# Patient Record
Sex: Male | Born: 1955
Health system: Southern US, Community
[De-identification: ages and names within clinical notes are randomized; demographics above are authoritative.]

## PROBLEM LIST (undated history)

## (undated) DIAGNOSIS — I10 Essential (primary) hypertension: Secondary | ICD-10-CM

## (undated) HISTORY — PX: CERVICAL SPINE SURGERY: SHX589

## (undated) HISTORY — PX: GUM SURGERY: SHX658

---

## 2013-04-17 ENCOUNTER — Ambulatory Visit: Payer: Self-pay

## 2013-04-22 ENCOUNTER — Ambulatory Visit: Payer: Self-pay

## 2013-05-14 ENCOUNTER — Ambulatory Visit: Payer: Medicaid Other | Attending: Internal Medicine | Admitting: Internal Medicine

## 2013-05-14 ENCOUNTER — Encounter: Payer: Self-pay | Admitting: Internal Medicine

## 2013-05-14 VITALS — BP 147/95 | HR 78 | Temp 98.7°F | Resp 17 | Wt 253.4 lb

## 2013-05-14 DIAGNOSIS — I10 Essential (primary) hypertension: Secondary | ICD-10-CM | POA: Insufficient documentation

## 2013-05-14 DIAGNOSIS — Z139 Encounter for screening, unspecified: Secondary | ICD-10-CM

## 2013-05-14 DIAGNOSIS — Z008 Encounter for other general examination: Secondary | ICD-10-CM | POA: Insufficient documentation

## 2013-05-14 DIAGNOSIS — E785 Hyperlipidemia, unspecified: Secondary | ICD-10-CM

## 2013-05-14 DIAGNOSIS — R229 Localized swelling, mass and lump, unspecified: Secondary | ICD-10-CM

## 2013-05-14 DIAGNOSIS — F17200 Nicotine dependence, unspecified, uncomplicated: Secondary | ICD-10-CM | POA: Insufficient documentation

## 2013-05-14 DIAGNOSIS — R351 Nocturia: Secondary | ICD-10-CM

## 2013-05-14 DIAGNOSIS — Z1211 Encounter for screening for malignant neoplasm of colon: Secondary | ICD-10-CM

## 2013-05-14 DIAGNOSIS — R222 Localized swelling, mass and lump, trunk: Secondary | ICD-10-CM | POA: Insufficient documentation

## 2013-05-14 NOTE — Patient Instructions (Signed)
DASH Diet  The DASH diet stands for "Dietary Approaches to Stop Hypertension." It is a healthy eating plan that has been shown to reduce high blood pressure (hypertension) in as little as 14 days, while also possibly providing other significant health benefits. These other health benefits include reducing the risk of breast cancer after menopause and reducing the risk of type 2 diabetes, heart disease, colon cancer, and stroke. Health benefits also include weight loss and slowing kidney failure in patients with chronic kidney disease.   DIET GUIDELINES  · Limit salt (sodium). Your diet should contain less than 1500 mg of sodium daily.  · Limit refined or processed carbohydrates. Your diet should include mostly whole grains. Desserts and added sugars should be used sparingly.  · Include small amounts of heart-healthy fats. These types of fats include nuts, oils, and tub margarine. Limit saturated and trans fats. These fats have been shown to be harmful in the body.  CHOOSING FOODS   The following food groups are based on a 2000 calorie diet. See your Registered Dietitian for individual calorie needs.  Grains and Grain Products (6 to 8 servings daily)  · Eat More Often: Whole-wheat bread, brown rice, whole-grain or wheat pasta, quinoa, popcorn without added fat or salt (air popped).  · Eat Less Often: White bread, white pasta, white rice, cornbread.  Vegetables (4 to 5 servings daily)  · Eat More Often: Fresh, frozen, and canned vegetables. Vegetables may be raw, steamed, roasted, or grilled with a minimal amount of fat.  · Eat Less Often/Avoid: Creamed or fried vegetables. Vegetables in a cheese sauce.  Fruit (4 to 5 servings daily)  · Eat More Often: All fresh, canned (in natural juice), or frozen fruits. Dried fruits without added sugar. One hundred percent fruit juice (½ cup [237 mL] daily).  · Eat Less Often: Dried fruits with added sugar. Canned fruit in light or heavy syrup.  Lean Meats, Fish, and Poultry (2  servings or less daily. One serving is 3 to 4 oz [85-114 g]).  · Eat More Often: Ninety percent or leaner ground beef, tenderloin, sirloin. Round cuts of beef, chicken breast, turkey breast. All fish. Grill, bake, or broil your meat. Nothing should be fried.  · Eat Less Often/Avoid: Fatty cuts of meat, turkey, or chicken leg, thigh, or wing. Fried cuts of meat or fish.  Dairy (2 to 3 servings)  · Eat More Often: Low-fat or fat-free milk, low-fat plain or light yogurt, reduced-fat or part-skim cheese.  · Eat Less Often/Avoid: Milk (whole, 2%). Whole milk yogurt. Full-fat cheeses.  Nuts, Seeds, and Legumes (4 to 5 servings per week)  · Eat More Often: All without added salt.  · Eat Less Often/Avoid: Salted nuts and seeds, canned beans with added salt.  Fats and Sweets (limited)  · Eat More Often: Vegetable oils, tub margarines without trans fats, sugar-free gelatin. Mayonnaise and salad dressings.  · Eat Less Often/Avoid: Coconut oils, palm oils, butter, stick margarine, cream, half and half, cookies, candy, pie.  FOR MORE INFORMATION  The Dash Diet Eating Plan: www.dashdiet.org  Document Released: 01/31/2011 Document Revised: 05/06/2011 Document Reviewed: 01/31/2011  ExitCare® Patient Information ©2014 [employer].

## 2013-05-14 NOTE — Progress Notes (Signed)
Patient here to establish care Takes no prescribed medications

## 2013-05-14 NOTE — Progress Notes (Signed)
Patient Demographics  Lawrence Everett, is a 58 y.o. male  XIP:[id number]  QBH:[id number]  DOB - [birth year]  CC:  Chief Complaint  Patient presents with  . Establish Care       HPI: Lawrence Everett is a 58 y.o. male here today to establish medical care. Patient has history of hypertension hyperlipidemia, has not taken any medication for that, patient is trying to lose weight to exercise and modify the diet, he also reported to have some daily and frequency denies any dysuria, he sometimes take some medication to help with nocturia, as per patient he had his prostate checked in the past, but reported to have family history of prostate cancer. Patient also reported to have noticed a lump on his back for several months no change in size not painful. Patient has No headache, No chest pain, No abdominal pain - No Nausea, No new weakness tingling or numbness, No Cough - SOB.  No Known Allergies History reviewed. No pertinent past medical history. No current outpatient prescriptions on file prior to visit.   No current facility-administered medications on file prior to visit.   Family History  Problem Relation Age of Onset  . Cancer Mother    History   Social History  . Marital Status: Unknown    Spouse Name: N/A    Number of Children: N/A  . Years of Education: N/A   Occupational History  . Not on file.   Social History Main Topics  . Smoking status: Light Tobacco Smoker  . Smokeless tobacco: Not on file     Comment: some marijuana  . Alcohol Use: No  . Drug Use: Yes    Special: Marijuana     Comment: 3 times a week   . Sexual Activity: Not on file   Other Topics Concern  . Not on file   Social History Narrative  . No narrative on file    Review of Systems: Constitutional: Negative for fever, chills, diaphoresis, activity change, appetite change and fatigue. HENT: Negative for ear pain, nosebleeds, congestion, facial swelling, rhinorrhea, neck pain, neck stiffness  and ear discharge.  Eyes: Negative for pain, discharge, redness, itching and visual disturbance. Respiratory: Negative for cough, choking, chest tightness, shortness of breath, wheezing and stridor.  Cardiovascular: Negative for chest pain, palpitations and leg swelling. Gastrointestinal: Negative for abdominal distention. Genitourinary: Negative for dysuria, urgency, +frequency, hematuria, flank pain, decreased urine volume, difficulty urinating and dyspareunia.  Musculoskeletal: Negative for back pain, joint swelling, arthralgia and gait problem. Neurological: Negative for dizziness, tremors, seizures, syncope, facial asymmetry, speech difficulty, weakness, light-headedness, numbness and headaches.  Hematological: Negative for adenopathy. Does not bruise/bleed easily. Psychiatric/Behavioral: Negative for hallucinations, behavioral problems, confusion, dysphoric mood, decreased concentration and agitation.    Objective:   Filed Vitals:   05/14/13 1128  BP: 147/95  Pulse: 78  Temp: 98.7 F (37.1 C)  Resp: 17    Physical Exam: Constitutional: Patient appears well-developed and well-nourished. No distress. HENT: Normocephalic, atraumatic, External right and left ear normal. Oropharynx is clear and moist.  Eyes: Conjunctivae and EOM are normal. PERRLA, no scleral icterus. Neck: Normal ROM. Neck supple. No JVD. No tracheal deviation. No thyromegaly. CVS: RRR, S1/S2 +, no murmurs, no gallops, no carotid bruit.  Pulmonary: Effort and breath sounds normal, no stridor, rhonchi, wheezes, rales.  Abdominal: Soft. BS +, no distension, tenderness, rebound or guarding.   Neuro: Alert. Normal reflexes, muscle tone coordination. No cranial nerve deficit. Skin: right upper back palpable lump  non tender mobile, no skin discharge  Psychiatric: Normal mood and affect. Behavior, judgment, thought content normal.  No results found for this basename: WBC, HGB, HCT, MCV, PLT   No results found for this  basename: CREATININE, BUN, NA, K, CL, CO2    No results found for this basename: HGBA1C   Lipid Panel  No results found for this basename: chol, trig, hdl, cholhdl, vldl, ldlcalc       Assessment and plan:   1. Other and unspecified hyperlipidemia Advise patient for low fat diet, will check lipid panel  2. Essential hypertension, benign Advised patient for DASH diet monitor blood pressure.   3. Screening Baseline fasting blood work - COMPLETE METABOLIC PANEL WITH GFR; Future - Lipid panel; Future - TSH; Future - Vit D  25 hydroxy (rtn osteoporosis monitoring); Future - Urinalysis - PSA; Future  4. Special screening for malignant neoplasms, colon  - Ambulatory referral to Gastroenterology  5. Nocturia  - Urinalysis  6. Lump of skin of back  ? Lipoma, discussed about if he notice any change, he will let us know then consider referral to general surgery.  Return in about 6 weeks (around 06/25/2013).   Lawrence Marek, MD

## 2013-05-15 LAB — URINALYSIS
BILIRUBIN URINE: NEGATIVE
GLUCOSE, UA: NEGATIVE mg/dL
HGB URINE DIPSTICK: NEGATIVE
KETONES UR: NEGATIVE mg/dL
Leukocytes, UA: NEGATIVE
Nitrite: NEGATIVE
PH: 6.5 (ref 5.0–8.0)
Protein, ur: NEGATIVE mg/dL
Specific Gravity, Urine: 1.019 (ref 1.005–1.030)
Urobilinogen, UA: 0.2 mg/dL (ref 0.0–1.0)

## 2013-05-28 ENCOUNTER — Ambulatory Visit: Payer: Medicaid Other | Attending: Internal Medicine

## 2013-06-14 ENCOUNTER — Encounter: Payer: Self-pay | Admitting: Internal Medicine

## 2013-06-28 ENCOUNTER — Encounter: Payer: Self-pay | Admitting: Internal Medicine

## 2013-06-28 ENCOUNTER — Ambulatory Visit: Payer: Medicaid Other | Attending: Internal Medicine | Admitting: Internal Medicine

## 2013-06-28 VITALS — BP 139/89 | HR 73 | Temp 98.7°F | Resp 16

## 2013-06-28 DIAGNOSIS — I10 Essential (primary) hypertension: Secondary | ICD-10-CM

## 2013-06-28 DIAGNOSIS — Z1211 Encounter for screening for malignant neoplasm of colon: Secondary | ICD-10-CM

## 2013-06-28 DIAGNOSIS — E785 Hyperlipidemia, unspecified: Secondary | ICD-10-CM

## 2013-06-28 DIAGNOSIS — K219 Gastro-esophageal reflux disease without esophagitis: Secondary | ICD-10-CM | POA: Insufficient documentation

## 2013-06-28 NOTE — Progress Notes (Signed)
    MRN: [id number] Name: Lawrence Everett  Sex: male Age: 58 y.o. DOB: [birth year]  Allergies: Review of patient's allergies indicates no known allergies.  Chief Complaint  Patient presents with  . Results    HPI: Patient is 58 y.o. male who has history of GERD, hyperlipidemia comes today for followup her not on medications, patient forgot to do blood work, denies any acute symptoms denies any headache dizziness chest and shortness of breath, patient is trying to lose weight his blood pressure is improved compared to last visit. Patient denies smoking cigarettes. Patient was referred to GI for screening colonoscopy at that time patient could not schedule now he has insurance and needs another referral.  History reviewed. No pertinent past medical history.  Past Surgical History  Procedure Laterality Date  . Gum surgery        Medication List    Notice As of 06/28/2013  3:23 PM   You have not been prescribed any medications.      No orders of the defined types were placed in this encounter.     There is no immunization history on file for this patient.  Family History  Problem Relation Age of Onset  . Cancer Mother     History  Substance Use Topics  . Smoking status: Light Tobacco Smoker  . Smokeless tobacco: Not on file     Comment: some marijuana  . Alcohol Use: No    Review of Systems   As noted in HPI  Filed Vitals:   06/28/13 1514  BP: 139/89  Pulse: 73  Temp: 98.7 F (37.1 C)  Resp: 16    Physical Exam  Physical Exam  Constitutional: No distress.  Eyes: EOM are normal. Pupils are equal, round, and reactive to light.  Cardiovascular: Normal rate and regular rhythm.   Pulmonary/Chest: Breath sounds normal. No respiratory distress. He has no wheezes. He has no rales.  Musculoskeletal: He exhibits no edema.    CBC No results found for this basename: wbc, rbc, hgb, hct, plt, mcv, neutrabs, lymphsabs, monoabs, eosabs, basosabs    CMP  No results  found for this basename: na, k, cl, co2, glucose, bun, creatinine, calcium, prot, albumin, ast, alt, alkphos, bilitot, gfrnonaa, gfraa    No results found for this basename: chol, tri, ldl    No components found with this basename: hga1c    No results found for this basename: AST    Assessment and Plan  Essential hypertension, benign Continue with DASH diet  and exercise.  Other and unspecified hyperlipidemia Will check lipid panel. Patient will come back for fasting blood work.   Special screening for malignant neoplasms, colon - Plan: Ambulatory referral to Gastroenterology   Health Maintenance -Colonoscopy: referred to GI    Return in about 3 months (around 09/28/2013) for hypertension.  Lorayne Marek, MD

## 2013-06-28 NOTE — Progress Notes (Signed)
Patient states her for lab results Patient did not return to do the labs that were in the system as future orders

## 2013-06-29 ENCOUNTER — Other Ambulatory Visit: Payer: Medicaid Other

## 2013-06-30 ENCOUNTER — Ambulatory Visit: Payer: Medicaid Other | Attending: Internal Medicine

## 2013-06-30 DIAGNOSIS — Z139 Encounter for screening, unspecified: Secondary | ICD-10-CM

## 2013-07-01 ENCOUNTER — Telehealth: Payer: Self-pay

## 2013-07-01 LAB — LIPID PANEL
CHOLESTEROL: 145 mg/dL (ref 0–200)
HDL: 31 mg/dL — ABNORMAL LOW (ref >39)
LDL Cholesterol: 101 mg/dL — ABNORMAL HIGH (ref 0–99)
TRIGLYCERIDES: 67 mg/dL (ref <150)
Total CHOL/HDL Ratio: 4.7 Ratio
VLDL: 13 mg/dL (ref 0–40)

## 2013-07-01 LAB — COMPLETE METABOLIC PANEL WITH GFR
ALBUMIN: 4.2 g/dL (ref 3.5–5.2)
ALT: 13 U/L (ref 0–53)
AST: 15 U/L (ref 0–37)
Alkaline Phosphatase: 67 U/L (ref 39–117)
BUN: 13 mg/dL (ref 6–23)
CALCIUM: 9.3 mg/dL (ref 8.4–10.5)
CHLORIDE: 102 meq/L (ref 96–112)
CO2: 26 meq/L (ref 19–32)
Creat: 0.92 mg/dL (ref 0.50–1.35)
GFR, Est African American: >89 mL/min
GFR, Est Non African American: >89 mL/min
Glucose, Bld: 82 mg/dL (ref 70–99)
Potassium: 4.4 mEq/L (ref 3.5–5.3)
Sodium: 139 mEq/L (ref 135–145)
Total Bilirubin: 0.7 mg/dL (ref 0.2–1.2)
Total Protein: 7.6 g/dL (ref 6.0–8.3)

## 2013-07-01 LAB — PSA: PSA: 0.53 ng/mL (ref <=4.00)

## 2013-07-01 LAB — TSH: TSH: 1.531 u[IU]/mL (ref 0.350–4.500)

## 2013-07-01 LAB — VITAMIN D 25 HYDROXY (VIT D DEFICIENCY, FRACTURES): Vit D, 25-Hydroxy: 26 ng/mL — ABNORMAL LOW (ref 30–89)

## 2013-07-01 MED ORDER — VITAMIN D (ERGOCALCIFEROL) 1.25 MG (50000 UNIT) PO CAPS: 50000.0000 [IU] | ORAL_CAPSULE | ORAL | Status: DC

## 2013-07-01 NOTE — Telephone Encounter (Signed)
Patient not available Left message on  Voice mail to return our call 

## 2013-07-01 NOTE — Telephone Encounter (Signed)
Message copied by Dorothe Pea on Thu Jul 01, 2013 11:40 AM ------      Message from: Lorayne Marek      Created: Thu Jul 01, 2013 10:39 AM       Blood work reviewed, noticed low vitamin D, call patient advise to start ergocalciferol 50,000 units once a week for the duration of  12 weeks.       ------

## 2013-07-02 ENCOUNTER — Telehealth: Payer: Self-pay

## 2013-07-02 NOTE — Telephone Encounter (Signed)
Patient returned phone call He is aware of his lab results and to pick up his new prescription for vitamin D

## 2013-07-29 ENCOUNTER — Encounter: Payer: Self-pay | Admitting: Internal Medicine

## 2013-09-28 ENCOUNTER — Ambulatory Visit: Payer: Medicaid Other | Admitting: Internal Medicine

## 2014-01-19 ENCOUNTER — Ambulatory Visit: Payer: Self-pay

## 2014-01-19 ENCOUNTER — Other Ambulatory Visit: Payer: Self-pay | Admitting: Occupational Medicine

## 2014-01-19 DIAGNOSIS — Z0000 Encounter for general adult medical examination without abnormal findings: Secondary | ICD-10-CM

## 2015-08-09 ENCOUNTER — Encounter: Payer: Self-pay | Admitting: Internal Medicine

## 2015-08-09 ENCOUNTER — Ambulatory Visit: Payer: Medicaid Other | Attending: Internal Medicine | Admitting: Internal Medicine

## 2015-08-09 VITALS — BP 149/97 | HR 67 | Temp 98.2°F | Wt 256.0 lb

## 2015-08-09 DIAGNOSIS — Z131 Encounter for screening for diabetes mellitus: Secondary | ICD-10-CM

## 2015-08-09 DIAGNOSIS — D229 Melanocytic nevi, unspecified: Secondary | ICD-10-CM

## 2015-08-09 DIAGNOSIS — Z136 Encounter for screening for cardiovascular disorders: Secondary | ICD-10-CM

## 2015-08-09 DIAGNOSIS — IMO0001 Reserved for inherently not codable concepts without codable children: Secondary | ICD-10-CM

## 2015-08-09 DIAGNOSIS — R222 Localized swelling, mass and lump, trunk: Secondary | ICD-10-CM

## 2015-08-09 DIAGNOSIS — Z13220 Encounter for screening for lipoid disorders: Secondary | ICD-10-CM

## 2015-08-09 DIAGNOSIS — L309 Dermatitis, unspecified: Secondary | ICD-10-CM

## 2015-08-09 DIAGNOSIS — Z125 Encounter for screening for malignant neoplasm of prostate: Secondary | ICD-10-CM

## 2015-08-09 DIAGNOSIS — I10 Essential (primary) hypertension: Secondary | ICD-10-CM

## 2015-08-09 DIAGNOSIS — D239 Other benign neoplasm of skin, unspecified: Secondary | ICD-10-CM

## 2015-08-09 LAB — CBC WITH DIFFERENTIAL/PLATELET
BASOS ABS: 0 {cells}/uL (ref 0–200)
Basophils Relative: 0 %
EOS ABS: 136 {cells}/uL (ref 15–500)
Eosinophils Relative: 2 %
HCT: 46.6 % (ref 38.5–50.0)
Hemoglobin: 15.1 g/dL (ref 13.2–17.1)
LYMPHS PCT: 27 %
Lymphs Abs: 1836 cells/uL (ref 850–3900)
MCH: 28.6 pg (ref 27.0–33.0)
MCHC: 32.4 g/dL (ref 32.0–36.0)
MCV: 88.3 fL (ref 80.0–100.0)
MONOS PCT: 7 %
MPV: 9.4 fL (ref 7.5–12.5)
Monocytes Absolute: 476 cells/uL (ref 200–950)
NEUTROS ABS: 4352 {cells}/uL (ref 1500–7800)
Neutrophils Relative %: 64 %
Platelets: 232 10*3/uL (ref 140–400)
RBC: 5.28 MIL/uL (ref 4.20–5.80)
RDW: 14.3 % (ref 11.0–15.0)
WBC: 6.8 10*3/uL (ref 3.8–10.8)

## 2015-08-09 LAB — BASIC METABOLIC PANEL WITHOUT GFR
BUN: 15 mg/dL (ref 7–25)
CO2: 25 mmol/L (ref 20–31)
Calcium: 9 mg/dL (ref 8.6–10.3)
Chloride: 104 mmol/L (ref 98–110)
Creat: 0.9 mg/dL (ref 0.70–1.25)
GFR, Est African American: >89 mL/min
GFR, Est Non African American: >89 mL/min
Glucose, Bld: 92 mg/dL (ref 65–99)
Potassium: 4.1 mmol/L (ref 3.5–5.3)
Sodium: 139 mmol/L (ref 135–146)

## 2015-08-09 LAB — HEMOGLOBIN A1C
Hgb A1c MFr Bld: 6 % — ABNORMAL HIGH
Mean Plasma Glucose: 126 mg/dL

## 2015-08-09 MED ORDER — TRIAMCINOLONE ACETONIDE 0.1 % EX CREA: 1.0000 "application " | TOPICAL_CREAM | Freq: Two times a day (BID) | CUTANEOUS | Status: AC

## 2015-08-09 MED ORDER — HYDROCHLOROTHIAZIDE 25 MG PO TABS: 25.0000 mg | ORAL_TABLET | Freq: Every day | ORAL | Status: DC

## 2015-08-09 MED FILL — TRIAMCINOLONE 0.1% CREAM: 0.1 | 15 days supply | Qty: 30 | Fill #0

## 2015-08-09 MED FILL — HYDROCHLOROTHIAZIDE 25 MG T: 25 | 30 days supply | Qty: 30 | Fill #0

## 2015-08-09 NOTE — Patient Instructions (Signed)
Low-Sodium Eating Plan Sodium raises blood pressure and causes water to be held in the body. Getting less sodium from food will help lower your blood pressure, reduce any swelling, and protect your heart, liver, and kidneys. We get sodium by adding salt (sodium chloride) to food. Most of our sodium comes from canned, boxed, and frozen foods. Restaurant foods, fast foods, and pizza are also very high in sodium. Even if you take medicine to lower your blood pressure or to reduce fluid in your body, getting less sodium from your food is important. WHAT IS MY PLAN? Most people should limit their sodium intake to 2,300 mg a day. Your health care provider recommends that you limit your sodium intake to 2gram /  day.  WHAT DO I NEED TO KNOW ABOUT THIS EATING PLAN? For the low-sodium eating plan, you will follow these general guidelines:  Choose foods with a % Daily Value for sodium of less than 5% (as listed on the food label).   Use salt-free seasonings or herbs instead of table salt or sea salt.   Check with your health care provider or pharmacist before using salt substitutes.   Eat fresh foods.  Eat more vegetables and fruits.  Limit canned vegetables. If you do use them, rinse them well to decrease the sodium.   Limit cheese to 1 oz (28 g) per day.   Eat lower-sodium products, often labeled as "lower sodium" or "no salt added."  Avoid foods that contain monosodium glutamate (MSG). MSG is sometimes added to [place] food and some canned foods.  Check food labels (Nutrition Facts labels) on foods to learn how much sodium is in one serving.  Eat more home-cooked food and less restaurant, buffet, and fast food.  When eating at a restaurant, ask that your food be prepared with less salt, or no salt if possible.  HOW DO I READ FOOD LABELS FOR SODIUM INFORMATION? The Nutrition Facts label lists the amount of sodium in one serving of the food. If you eat more than one serving, you must  multiply the listed amount of sodium by the number of servings. Food labels may also identify foods as:  Sodium free--Less than 5 mg in a serving.  Very low sodium--35 mg or less in a serving.  Low sodium--140 mg or less in a serving.  Light in sodium--50% less sodium in a serving. For example, if a food that usually has 300 mg of sodium is changed to become light in sodium, it will have 150 mg of sodium.  Reduced sodium--25% less sodium in a serving. For example, if a food that usually has 400 mg of sodium is changed to reduced sodium, it will have 300 mg of sodium. WHAT FOODS CAN I EAT? Grains Low-sodium cereals, including oats, puffed wheat and rice, and shredded wheat cereals. Low-sodium crackers. Unsalted rice and pasta. Lower-sodium bread.  Vegetables Frozen or fresh vegetables. Low-sodium or reduced-sodium canned vegetables. Low-sodium or reduced-sodium tomato sauce and paste. Low-sodium or reduced-sodium tomato and vegetable juices.  Fruits Fresh, frozen, and canned fruit. Fruit juice.  Meat and Other Protein Products Low-sodium canned tuna and salmon. Fresh or frozen meat, poultry, seafood, and fish. Lamb. Unsalted nuts. Dried beans, peas, and lentils without added salt. Unsalted canned beans. Homemade soups without salt. Eggs.  Dairy Milk. Soy milk. Ricotta cheese. Low-sodium or reduced-sodium cheeses. Yogurt.  Condiments Fresh and dried herbs and spices. Salt-free seasonings. Onion and garlic powders. Low-sodium varieties of mustard and ketchup. Fresh or refrigerated horseradish.  Lemon juice.  Fats and Oils Reduced-sodium salad dressings. Unsalted butter.  Other Unsalted popcorn and pretzels.  The items listed above may not be a complete list of recommended foods or beverages. Contact your dietitian for more options. WHAT FOODS ARE NOT RECOMMENDED? Grains Instant hot cereals. Bread stuffing, pancake, and biscuit mixes. Croutons. Seasoned rice or pasta mixes.  Noodle soup cups. Boxed or frozen macaroni and cheese. Self-rising flour. Regular salted crackers. Vegetables Regular canned vegetables. Regular canned tomato sauce and paste. Regular tomato and vegetable juices. Frozen vegetables in sauces. Salted [place] fries. Olives. Angie Fava. Relishes. Sauerkraut. Salsa. Meat and Other Protein Products Salted, canned, smoked, spiced, or pickled meats, seafood, or fish. Bacon, ham, sausage, hot dogs, corned beef, chipped beef, and packaged luncheon meats. Salt pork. Jerky. Pickled herring. Anchovies, regular canned tuna, and sardines. Salted nuts. Dairy Processed cheese and cheese spreads. Cheese curds. Blue cheese and cottage cheese. Buttermilk.  Condiments Onion and garlic salt, seasoned salt, table salt, and sea salt. Canned and packaged gravies. Worcestershire sauce. Tartar sauce. Barbecue sauce. Teriyaki sauce. Soy sauce, including reduced sodium. Steak sauce. Fish sauce. Oyster sauce. Cocktail sauce. Horseradish that you find on the shelf. Regular ketchup and mustard. Meat flavorings and tenderizers. Bouillon cubes. Hot sauce. Tabasco sauce. Marinades. Taco seasonings. Relishes. Fats and Oils Regular salad dressings. Salted butter. Margarine. Ghee. Bacon fat.  Other Potato and tortilla chips. Corn chips and puffs. Salted popcorn and pretzels. Canned or dried soups. Pizza. Frozen entrees and pot pies.  The items listed above may not be a complete list of foods and beverages to avoid. Contact your dietitian for more information.   This information is not intended to replace advice given to you by your health care provider. Make sure you discuss any questions you have with your health care provider.   Document Released: 08/03/2001 Document Revised: 03/04/2014 Document Reviewed: 12/16/2012 Elsevier Interactive Patient Education 2016 Else[employer] Inc.  - High-Fiber Diet Fiber, also called dietary fiber, is a type of carbohydrate found in fruits,  vegetables, whole grains, and beans. A high-fiber diet can have many health benefits. Your health care provider may recommend a high-fiber diet to help:  Prevent constipation. Fiber can make your bowel movements more regular.  Lower your cholesterol.  Relieve hemorrhoids, uncomplicated diverticulosis, or irritable bowel syndrome.  Prevent overeating as part of a weight-loss plan.  Prevent heart disease, type 2 diabetes, and certain cancers. WHAT IS MY PLAN? The recommended daily intake of fiber includes:  38 grams for men under age 34.  30 grams for men over age 17.  54 grams for women under age 49.  17 grams for women over age 53. You can get the recommended daily intake of dietary fiber by eating a variety of fruits, vegetables, grains, and beans. Your health care provider may also recommend a fiber supplement if it is not possible to get enough fiber through your diet. WHAT DO I NEED TO KNOW ABOUT A HIGH-FIBER DIET?  Fiber supplements have not been widely studied for their effectiveness, so it is better to get fiber through food sources.  Always check the fiber content on thenutrition facts label of any prepackaged food. Look for foods that contain at least 5 grams of fiber per serving.  Ask your dietitian if you have questions about specific foods that are related to your condition, especially if those foods are not listed in the following section.  Increase your daily fiber consumption gradually. Increasing your intake of dietary fiber too quickly may  cause bloating, cramping, or gas.  Drink plenty of water. Water helps you to digest fiber. WHAT FOODS CAN I EAT? Grains Whole-grain breads. Multigrain cereal. Oats and oatmeal. Brown rice. Barley. Bulgur wheat. [place]. Bran muffins. Popcorn. Rye wafer crackers. Vegetables Sweet potatoes. Spinach. Kale. Artichokes. Cabbage. Broccoli. Green peas. Carrots. Squash. Fruits Berries. Pears. Apples. Oranges. Avocados. Prunes and  raisins. Dried figs. Meats and Other Protein Sources [place], kidney, pinto, and soy beans. Split peas. Lentils. Nuts and seeds. Dairy Fiber-fortified yogurt. Beverages Fiber-fortified soy milk. Fiber-fortified orange juice. Other Fiber bars. The items listed above may not be a complete list of recommended foods or beverages. Contact your dietitian for more options. WHAT FOODS ARE NOT RECOMMENDED? Grains White bread. Pasta made with refined flour. White rice. Vegetables Fried potatoes. Canned vegetables. Well-cooked vegetables.  Fruits Fruit juice. Cooked, strained fruit. Meats and Other Protein Sources Fatty cuts of meat. Fried Sales executive or fried fish. Dairy Milk. Yogurt. Cream cheese. Sour cream. Beverages Soft drinks. Other Cakes and pastries. Butter and oils. The items listed above may not be a complete list of foods and beverages to avoid. Contact your dietitian for more information. WHAT ARE SOME TIPS FOR INCLUDING HIGH-FIBER FOODS IN MY DIET?  Eat a wide variety of high-fiber foods.  Make sure that half of all grains consumed each day are whole grains.  Replace breads and cereals made from refined flour or white flour with whole-grain breads and cereals.  Replace white rice with brown rice, bulgur wheat, or millet.  Start the day with a breakfast that is high in fiber, such as a cereal that contains at least 5 grams of fiber per serving.  Use beans in place of meat in soups, salads, or pasta.  Eat high-fiber snacks, such as berries, raw vegetables, nuts, or popcorn.   This information is not intended to replace advice given to you by your health care provider. Make sure you discuss any questions you have with your health care provider.   Document Released: 02/11/2005 Document Revised: 03/04/2014 Document Reviewed: 07/27/2013 Elsevier Interactive Patient Education [employer].

## 2015-08-09 NOTE — Progress Notes (Signed)
Lawrence Everett, is a 60 y.o. male  [id number]  [id number]  DOB - 07/28/1955  CC:  Chief Complaint  Patient presents with  . Eczema    both  . Hypertension       HPI: Lawrence Everett is a 60 y.o. male here today to establish medical care., w/ hx of htn, le excema, last seen in clinic 2015.  Here today w/ his wife, wants to take better care of himself.  Mild c/o of arthritis in hands and knees, nothing too bothersome.  Pt had colonoscopy about 1 year ago (we do not have records), and told to f/u in 5 years due to polyps and internal hemorrhoids.  Pt has numerous nevi on back, as well as small lump on his right upper shoulder that has been there for years.  Does not smoke or drink etoh. Diet includes some salt/sweets, had small veg juice/shake this am.  Pt wants to eat more fruit/veg and willing to watch salt/exercise more to get healthy.  Patient has No headache, No chest pain, No abdominal pain - No Nausea, No new weakness tingling or numbness, No Cough - SOB.  Wife here w/ Pt.  No Known Allergies No past medical history on file. Current Outpatient Prescriptions on File Prior to Visit  Medication Sig Dispense Refill  . Vitamin D, Ergocalciferol, (DRISDOL) 50000 UNITS CAPS capsule Take 1 capsule (50,000 Units total) by mouth every 7 (seven) days. (Patient not taking: Reported on 08/09/2015) 12 capsule 0   No current facility-administered medications on file prior to visit.   Family History  Problem Relation Age of Onset  . Cancer Mother   mom had breast cancer, dad prostate cancer  Social History   Social History  . Marital Status: Unknown    Spouse Name: N/A  . Number of Children: N/A  . Years of Education: N/A   Occupational History  . Not on file.   Social History Main Topics  . Smoking status: Light Tobacco Smoker  . Smokeless tobacco: Not on file     Comment: some marijuana  . Alcohol Use: No  . Drug Use: Yes    Special: Marijuana     Comment: 3  times a week   . Sexual Activity: Not on file   Other Topics Concern  . Not on file   Social History Narrative    Review of Systems: Per HPI, o/w all systems reviewed and negative.  Objective:   Filed Vitals:   08/09/15 1013  BP: 149/97  Pulse: 67  Temp: 98.2 F (36.8 C)    Filed Weights   08/09/15 1013  Weight: 256 lb (116.121 kg)    BP Readings from Last 3 Encounters:  08/09/15 149/97  06/28/13 139/89  05/14/13 147/95    Physical Exam: Constitutional: Patient appears well-developed and well-nourished. No distress. AAOx3, pleasant,  HENT: Normocephalic, atraumatic, External right and left ear normal. Oropharynx is clear and moist.  bilat TMs clear. Eyes: Conjunctivae and EOM are normal. PERRL, no scleral icterus. Neck: Normal ROM. Neck supple. No JVD.  CVS: RRR, S1/S2 +, no murmurs, no gallops, no carotid bruit bilat. Pulmonary: Effort and breath sounds normal, no stridor, rhonchi, wheezes, rales.  Abdominal: Soft. BS +, no distension, tenderness, rebound or guarding.  Musculoskeletal: Normal range of motion. No edema and no tenderness.  LE: bilat/ no c/c/e, pulses 2+ bilateral. Neuro: Alert. good muscle tone coordination. No cranial nerve deficit grossly. Skin: Skin is warm and dry.  Numerous seborrheic keratosis on  back in Christmas Tree pattern, also a couple of red, atypical nevi, largest is 5mm on left middle back.  Small lump, mobile, upper right back near scapula, about 1cm, nttp.- known for years per pt/wife. Psychiatric: Normal mood and affect. Behavior, judgment, thought content normal.  No results found for: WBC, HGB, HCT, MCV, PLT Lab Results  Component Value Date   CREATININE 0.92 06/30/2013   BUN 13 06/30/2013   NA 139 06/30/2013   K 4.4 06/30/2013   CL 102 06/30/2013   CO2 26 06/30/2013    No results found for: HGBA1C Lipid Panel     Component Value Date/Time   CHOL 145 06/30/2013 1503   TRIG 67 06/30/2013 1503   HDL 31* 06/30/2013 1503    CHOLHDL 4.7 06/30/2013 1503   VLDL 13 06/30/2013 1503   LDLCALC 101* 06/30/2013 1503       Depression screen PHQ 2/9 08/09/2015  Decreased Interest 0  Down, Depressed, Hopeless 0  PHQ - 2 Score 0  Altered sleeping 0  Tired, decreased energy 0  Change in appetite 0  Feeling bad or failure about yourself  0  Trouble concentrating 0  Moving slowly or fidgety/restless 0  Suicidal thoughts 0  PHQ-9 Score 0  Difficult doing work/chores Not difficult at all    Assessment and plan:   1. Essential hypertension, benign - low salt diet discussed, recd increase exercise/more water as well. - BASIC METABOLIC PANEL WITH GFR - CBC with Differential - hctz 25 qd, recd to watch for dehydration if out in sun a lot.  2. Lump of skin of back, right upper back/scapula region Likely benign cyst, vs old ingrown blackhead.  3. Atypical nevi, 2 berry- nevi noted, w/ too numerous to count seborrheic keratosis on back,  - Ambulatory referral to Dermatology for skin check.  4. Prostate cancer screening Family hx of father w/ prostate cancer - PSA, Medicare  5. Diabetes mellitus screening - Hemoglobin A1c  6. Encounter for cholesteral screening for cardiovascular disease chk fasting lipids  7. Eczema, le, intermittant None on exam today, pt uses prn triamcilone cream. Recd use triamcilone cream prn, may cause whitening/thinning of skin from long term use, pt/wife states understandings, renewed cream - recd oatmeal baths/emoilants such as vasoline to keep moisture barrier as well  8. Mild c/o of arthritis hands/knees, nondibilitating - follow.  9. Health Maintenance - pt states had colonoscopy 2016, +polyps/internal hemorrhoids, needs repeat in 5 years per report; asked him  To bring a copy of report for our documentation. - recd high fiber.  Return in about 3 months (around 11/09/2015), or if symptoms worsen or fail to improve, for htn..  The patient was given clear instructions to go  to ER or return to medical center if symptoms don't improve, worsen or new problems develop. The patient verbalized understanding. The patient was told to call to get lab results if they haven't heard anything in the next week.      Maren Reamer, MD, [place], [place]   08/09/2015, 10:40 AM

## 2015-08-10 ENCOUNTER — Other Ambulatory Visit: Payer: Self-pay | Admitting: Internal Medicine

## 2015-08-10 LAB — PSA, MEDICARE: PSA: 0.69 ng/mL (ref <=4.00)

## 2015-08-10 MED ORDER — METFORMIN HCL ER 500 MG PO TB24: 500.0000 mg | ORAL_TABLET | Freq: Every day | ORAL | Status: DC

## 2015-08-16 MED FILL — METFORMIN HCL ER 500 MG TAB: 500 | 30 days supply | Qty: 30 | Fill #0

## 2018-11-16 ENCOUNTER — Other Ambulatory Visit: Payer: Self-pay

## 2018-11-16 ENCOUNTER — Ambulatory Visit: Payer: Self-pay

## 2018-11-16 ENCOUNTER — Other Ambulatory Visit: Payer: Self-pay | Admitting: Family Medicine

## 2018-11-16 DIAGNOSIS — Z0000 Encounter for general adult medical examination without abnormal findings: Secondary | ICD-10-CM

## 2019-09-25 ENCOUNTER — Encounter (HOSPITAL_BASED_OUTPATIENT_CLINIC_OR_DEPARTMENT_OTHER): Payer: Self-pay

## 2019-09-25 ENCOUNTER — Other Ambulatory Visit: Payer: Self-pay

## 2019-09-25 DIAGNOSIS — Z87891 Personal history of nicotine dependence: Secondary | ICD-10-CM | POA: Diagnosis not present

## 2019-09-25 DIAGNOSIS — Y939 Activity, unspecified: Secondary | ICD-10-CM | POA: Diagnosis not present

## 2019-09-25 DIAGNOSIS — W57XXXA Bitten or stung by nonvenomous insect and other nonvenomous arthropods, initial encounter: Secondary | ICD-10-CM | POA: Insufficient documentation

## 2019-09-25 DIAGNOSIS — I10 Essential (primary) hypertension: Secondary | ICD-10-CM | POA: Insufficient documentation

## 2019-09-25 DIAGNOSIS — S30861A Insect bite (nonvenomous) of abdominal wall, initial encounter: Secondary | ICD-10-CM | POA: Insufficient documentation

## 2019-09-25 DIAGNOSIS — Y929 Unspecified place or not applicable: Secondary | ICD-10-CM | POA: Insufficient documentation

## 2019-09-25 DIAGNOSIS — Y999 Unspecified external cause status: Secondary | ICD-10-CM | POA: Insufficient documentation

## 2019-09-25 DIAGNOSIS — Z79899 Other long term (current) drug therapy: Secondary | ICD-10-CM | POA: Diagnosis not present

## 2019-09-25 NOTE — ED Triage Notes (Signed)
Pt was bitten by an insect to his R flank this evening. Localized redness noted in triage.

## 2019-09-26 ENCOUNTER — Emergency Department (HOSPITAL_BASED_OUTPATIENT_CLINIC_OR_DEPARTMENT_OTHER)
Admission: EM | Admit: 2019-09-26 | Discharge: 2019-09-26 | Disposition: A | Discharge status: Home / Self Care | Payer: Medicare HMO | Attending: Emergency Medicine | Admitting: Emergency Medicine

## 2019-09-26 DIAGNOSIS — S30861A Insect bite (nonvenomous) of abdominal wall, initial encounter: Secondary | ICD-10-CM

## 2019-09-26 DIAGNOSIS — I10 Essential (primary) hypertension: Secondary | ICD-10-CM

## 2019-09-26 HISTORY — DX: Essential (primary) hypertension: I10

## 2019-09-26 MED ORDER — ACETAMINOPHEN 325 MG PO TABS
650.0000 mg | ORAL_TABLET | Freq: Once | ORAL | Status: AC
  Administered 2019-09-26: 650 mg via ORAL
  Filled 2019-09-26: qty 2

## 2019-09-26 MED ORDER — DEXAMETHASONE 6 MG PO TABS
10.0000 mg | ORAL_TABLET | Freq: Once | ORAL | Status: AC
  Administered 2019-09-26: 10 mg via ORAL
  Filled 2019-09-26: qty 1

## 2019-09-26 NOTE — ED Provider Notes (Signed)
[facility] DEPART[facility] Provider Note   CSN: [id number] Arrival date & time: 09/25/19  2031   History Chief Complaint  Patient presents with  . Insect Bite    Lawrence Everett is a 64 y.o. male.  The history is provided by the patient.  He has history of hypertension and comes in after having been bitten by an unknown insect.  He had stopped on the side of the road to take a picture of his wife by some flowers when he was bitten on the right side of his abdomen.  He denies any generalized rash, but there was localized swelling.  He denied any difficulty breathing or swallowing.  There has been no treatment.  Pain has subsided while he has been waiting for me to come see him.  There has been no treatment up to this point.265  Past Medical History:  Diagnosis Date  . Hypertension     Patient Active Problem List   Diagnosis Date Noted  . Other and unspecified hyperlipidemia 05/14/2013  . Essential hypertension, benign 05/14/2013  . Lump of skin of back 05/14/2013  . Nocturia 05/14/2013    Past Surgical History:  Procedure Laterality Date  . GUM SURGERY         Family History  Problem Relation Age of Onset  . Cancer Mother     Social History   Tobacco Use  . Smoking status: Former Research scientist (life sciences)  . Smokeless tobacco: Never Used  . Tobacco comment: some marijuana  Vaping Use  . Vaping Use: Never used  Substance Use Topics  . Alcohol use: No  . Drug use: Yes    Types: Marijuana    Comment: 3 times a week     Home Medications Prior to Admission medications   Medication Sig Start Date End Date Taking? Authorizing Provider  amLODipine (NORVASC) 10 MG tablet amlodipine 10 mg tablet  Take 1 tablet every day by oral route for 90 days.    [provider]  hydrochlorothiazide (HYDRODIURIL) 25 MG tablet Take 1 tablet (25 mg total) by mouth daily. 08/09/15   Maren Reamer, MD  losartan (COZAAR) 50 MG tablet losartan 50 mg tablet  Take 1 tablet  every day by oral route in the morning for 90 days.    [provider]  metFORMIN (GLUCOPHAGE XR) 500 MG 24 hr tablet Take 1 tablet (500 mg total) by mouth daily with breakfast. 08/10/15   Lottie Mussel T, MD  triamcinolone cream (KENALOG) 0.1 % Apply 1 application topically 2 (two) times daily. 08/09/15   Maren Reamer, MD  Vitamin D, Ergocalciferol, (DRISDOL) 50000 UNITS CAPS capsule Take 1 capsule (50,000 Units total) by mouth every 7 (seven) days. Patient not taking: Reported on 08/09/2015 07/01/13   Lorayne Marek, MD    Allergies    Patient has no known allergies.  Review of Systems   Review of Systems  All other systems reviewed and are negative.   Physical Exam Updated Vital Signs BP (!) 153/92 (BP Location: Right Arm)   Pulse 51   Temp 98.8 F (37.1 C) (Oral)   Resp 20   Ht 5' 11.5" (1.816 m)   Wt (!) 115.7 kg   SpO2 100%   BMI 35.07 kg/m   Physical Exam Vitals and nursing note reviewed.   64 year old male, resting comfortably and in no acute distress. Vital signs are significant for elevated blood pressure. Oxygen saturation is 100%, which is normal. Head is normocephalic and atraumatic.  PERRLA, EOMI. Oropharynx is clear. Neck is nontender and supple without adenopathy or JVD. Back is nontender and there is no CVA tenderness. Lungs are clear without rales, wheezes, or rhonchi. Chest is nontender. Heart has regular rate and rhythm without murmur. Abdomen is soft, flat, nontender without masses or hepatosplenomegaly and peristalsis is normoactive.  Area of erythema and mild swelling noted on the right lateral abdomen.  There is a central vesicle. Extremities have no cyanosis or edema, full range of motion is present. Skin is warm and dry without other rash. Neurologic: Mental status is normal, cranial nerves are intact, there are no motor or sensory deficits.  ED Results / Procedures / Treatments    Procedures Procedures)  Medications Ordered in  ED Medications  acetaminophen (TYLENOL) tablet 650 mg (has no administration in time range)  dexamethasone (DECADRON) tablet 10 mg (10 mg Oral Given 09/26/19 1937)    ED Course  I have reviewed the triage vital signs and the nursing notes.  MDM Rules/Calculators/A&P Insect bite of the abdomen.  No evidence of generalized reaction.  Patient is advised on appropriate local measures, told to use over-the-counter analgesics and antihistamines as needed.  Old records are reviewed, and he has no relevant past visits.  Final Clinical Impression(s) / ED Diagnoses Final diagnoses:  Insect bite of abdomen, initial encounter  Elevated blood pressure reading with diagnosis of hypertension    Rx / DC Orders ED Discharge Orders    None       Delora Fuel, MD 90/24/09 [phone]

## 2019-09-26 NOTE — Discharge Instructions (Addendum)
Apply ice as needed.  Take naproxen twice a day, as needed, for pain. You may take acetaminophen as needed for additional pain relief.  Take diphenhydramine as needed for itching.  Apply hydrocortisone cream twice a day.

## 2019-10-11 DIAGNOSIS — I10 Essential (primary) hypertension: Secondary | ICD-10-CM | POA: Diagnosis not present

## 2019-11-08 DIAGNOSIS — D229 Melanocytic nevi, unspecified: Secondary | ICD-10-CM | POA: Diagnosis not present

## 2019-11-08 DIAGNOSIS — I10 Essential (primary) hypertension: Secondary | ICD-10-CM | POA: Diagnosis not present

## 2019-11-08 DIAGNOSIS — Z1211 Encounter for screening for malignant neoplasm of colon: Secondary | ICD-10-CM | POA: Diagnosis not present

## 2019-11-08 DIAGNOSIS — Z23 Encounter for immunization: Secondary | ICD-10-CM | POA: Diagnosis not present

## 2019-11-08 DIAGNOSIS — Z125 Encounter for screening for malignant neoplasm of prostate: Secondary | ICD-10-CM | POA: Diagnosis not present

## 2019-11-08 DIAGNOSIS — R0683 Snoring: Secondary | ICD-10-CM | POA: Diagnosis not present

## 2020-03-15 DIAGNOSIS — J329 Chronic sinusitis, unspecified: Secondary | ICD-10-CM | POA: Diagnosis not present

## 2020-03-15 DIAGNOSIS — S0990XA Unspecified injury of head, initial encounter: Secondary | ICD-10-CM | POA: Diagnosis not present

## 2020-03-15 DIAGNOSIS — R42 Dizziness and giddiness: Secondary | ICD-10-CM | POA: Diagnosis not present

## 2020-04-06 DIAGNOSIS — H61892 Other specified disorders of left external ear: Secondary | ICD-10-CM | POA: Diagnosis not present

## 2020-04-06 DIAGNOSIS — I10 Essential (primary) hypertension: Secondary | ICD-10-CM | POA: Diagnosis not present

## 2020-04-06 DIAGNOSIS — E782 Mixed hyperlipidemia: Secondary | ICD-10-CM | POA: Diagnosis not present

## 2020-04-07 DIAGNOSIS — L821 Other seborrheic keratosis: Secondary | ICD-10-CM | POA: Diagnosis not present

## 2020-04-07 DIAGNOSIS — I8393 Asymptomatic varicose veins of bilateral lower extremities: Secondary | ICD-10-CM | POA: Diagnosis not present

## 2020-04-07 DIAGNOSIS — L853 Xerosis cutis: Secondary | ICD-10-CM | POA: Diagnosis not present

## 2020-04-07 DIAGNOSIS — L729 Follicular cyst of the skin and subcutaneous tissue, unspecified: Secondary | ICD-10-CM | POA: Diagnosis not present

## 2020-04-07 DIAGNOSIS — D229 Melanocytic nevi, unspecified: Secondary | ICD-10-CM | POA: Diagnosis not present

## 2020-04-07 DIAGNOSIS — L738 Other specified follicular disorders: Secondary | ICD-10-CM | POA: Diagnosis not present

## 2020-04-07 DIAGNOSIS — D1801 Hemangioma of skin and subcutaneous tissue: Secondary | ICD-10-CM | POA: Diagnosis not present

## 2020-04-20 DIAGNOSIS — N529 Male erectile dysfunction, unspecified: Secondary | ICD-10-CM | POA: Diagnosis not present

## 2020-04-20 DIAGNOSIS — I10 Essential (primary) hypertension: Secondary | ICD-10-CM | POA: Diagnosis not present

## 2020-05-09 DIAGNOSIS — L720 Epidermal cyst: Secondary | ICD-10-CM | POA: Diagnosis not present

## 2020-08-09 ENCOUNTER — Other Ambulatory Visit: Payer: Self-pay

## 2020-10-20 ENCOUNTER — Emergency Department (HOSPITAL_BASED_OUTPATIENT_CLINIC_OR_DEPARTMENT_OTHER)
Admission: EM | Admit: 2020-10-20 | Discharge: 2020-10-21 | Disposition: A | Discharge status: Home / Self Care | Payer: Medicare HMO | Attending: Emergency Medicine | Admitting: Emergency Medicine

## 2020-10-20 ENCOUNTER — Other Ambulatory Visit: Payer: Self-pay

## 2020-10-20 ENCOUNTER — Emergency Department (HOSPITAL_BASED_OUTPATIENT_CLINIC_OR_DEPARTMENT_OTHER): Payer: Medicare HMO

## 2020-10-20 ENCOUNTER — Encounter (HOSPITAL_BASED_OUTPATIENT_CLINIC_OR_DEPARTMENT_OTHER): Payer: Self-pay | Admitting: *Deleted

## 2020-10-20 DIAGNOSIS — Z87891 Personal history of nicotine dependence: Secondary | ICD-10-CM | POA: Insufficient documentation

## 2020-10-20 DIAGNOSIS — R42 Dizziness and giddiness: Secondary | ICD-10-CM

## 2020-10-20 DIAGNOSIS — Z79899 Other long term (current) drug therapy: Secondary | ICD-10-CM | POA: Diagnosis not present

## 2020-10-20 DIAGNOSIS — I10 Essential (primary) hypertension: Secondary | ICD-10-CM | POA: Diagnosis not present

## 2020-10-20 DIAGNOSIS — I63311 Cerebral infarction due to thrombosis of right middle cerebral artery: Secondary | ICD-10-CM | POA: Insufficient documentation

## 2020-10-20 DIAGNOSIS — I6381 Other cerebral infarction due to occlusion or stenosis of small artery: Secondary | ICD-10-CM

## 2020-10-20 LAB — CBC WITH DIFFERENTIAL/PLATELET
Abs Immature Granulocytes: 0.01 10*3/uL (ref 0.00–0.07)
Basophils Absolute: 0 10*3/uL (ref 0.0–0.1)
Basophils Relative: 0 %
Eosinophils Absolute: 0.3 10*3/uL (ref 0.0–0.5)
Eosinophils Relative: 4 %
HCT: 41.8 % (ref 39.0–52.0)
Hemoglobin: 13.8 g/dL (ref 13.0–17.0)
Immature Granulocytes: 0 %
Lymphocytes Relative: 28 %
Lymphs Abs: 2.1 10*3/uL (ref 0.7–4.0)
MCH: 29.2 pg (ref 26.0–34.0)
MCHC: 33 g/dL (ref 30.0–36.0)
MCV: 88.6 fL (ref 80.0–100.0)
Monocytes Absolute: 0.8 10*3/uL (ref 0.1–1.0)
Monocytes Relative: 10 %
Neutro Abs: 4.3 10*3/uL (ref 1.7–7.7)
Neutrophils Relative %: 58 %
Platelets: 215 10*3/uL (ref 150–400)
RBC: 4.72 MIL/uL (ref 4.22–5.81)
RDW: 14.9 % (ref 11.5–15.5)
WBC: 7.4 10*3/uL (ref 4.0–10.5)
nRBC: 0 % (ref 0.0–0.2)

## 2020-10-20 LAB — COMPREHENSIVE METABOLIC PANEL
ALT: 20 U/L (ref 0–44)
AST: 23 U/L (ref 15–41)
Albumin: 4.2 g/dL (ref 3.5–5.0)
Alkaline Phosphatase: 75 U/L (ref 38–126)
Anion gap: 10 (ref 5–15)
BUN: 14 mg/dL (ref 8–23)
CO2: 25 mmol/L (ref 22–32)
Calcium: 9.3 mg/dL (ref 8.9–10.3)
Chloride: 104 mmol/L (ref 98–111)
Creatinine, Ser: 0.8 mg/dL (ref 0.61–1.24)
GFR, Estimated: >60 mL/min (ref >60)
Glucose, Bld: 99 mg/dL (ref 70–99)
Potassium: 3.9 mmol/L (ref 3.5–5.1)
Sodium: 139 mmol/L (ref 135–145)
Total Bilirubin: 0.6 mg/dL (ref 0.3–1.2)
Total Protein: 8.1 g/dL (ref 6.5–8.1)

## 2020-10-20 LAB — URINALYSIS, MICROSCOPIC (REFLEX): WBC, UA: NONE SEEN WBC/hpf (ref 0–5)

## 2020-10-20 LAB — URINALYSIS, ROUTINE W REFLEX MICROSCOPIC
Bilirubin Urine: NEGATIVE
Glucose, UA: NEGATIVE mg/dL
Hgb urine dipstick: NEGATIVE
Ketones, ur: NEGATIVE mg/dL
Leukocytes,Ua: NEGATIVE
Nitrite: NEGATIVE
Protein, ur: 30 mg/dL — AB
Specific Gravity, Urine: 1.025 (ref 1.005–1.030)
pH: 7 (ref 5.0–8.0)

## 2020-10-20 LAB — CBG MONITORING, ED: Glucose-Capillary: 96 mg/dL (ref 70–99)

## 2020-10-20 MED ORDER — ACETAMINOPHEN 325 MG PO TABS
650.0000 mg | ORAL_TABLET | Freq: Once | ORAL | Status: AC
  Administered 2020-10-21: 650 mg via ORAL
  Filled 2020-10-20: qty 2

## 2020-10-20 MED ORDER — LEVETIRACETAM IN NACL 500 MG/100ML IV SOLN
500.0000 mg | Freq: Once | INTRAVENOUS | Status: AC
  Administered 2020-10-20: 500 mg via INTRAVENOUS
  Filled 2020-10-20: qty 100

## 2020-10-20 MED ORDER — MECLIZINE HCL 25 MG PO TABS
50.0000 mg | ORAL_TABLET | Freq: Once | ORAL | Status: AC
  Administered 2020-10-20: 50 mg via ORAL
  Filled 2020-10-20: qty 2
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
## 2020-10-20 NOTE — ED Notes (Signed)
VAN negative

## 2020-10-20 NOTE — ED Triage Notes (Signed)
Pt BIB [facility] from [facility] for MRI. Pt A&O x4 reports no pain at this time. Dizziness remains

## 2020-10-20 NOTE — ED Triage Notes (Signed)
EMS transport from home. Pt reports he was hospitalized in [place] in July and had surgery on C 3, 5, and 7. He is wearing [place] J collar from home.  Pt states he has been having intermittent "dizziness" since the surgery. Sx worse since yesterday. States he  "feels like being on a merry-go-round". Alert and oriented x 4

## 2020-10-20 NOTE — ED Provider Notes (Signed)
65 year old male received as a transfer from Dr. Billy Fischer for MRI. In brief, the patient had surgery on C3, C5, and C7 in [place] 1 month ago today.  Approximately 5 days after his surgery, he had 3 seizures, blood pressure dropped into the 90s, he had difficulty swallowing, taking medications, eating, was readmitted to the hospital with pneumonia, staph infection, and gastric ulcers.  Since seizures were new, he was started on levocetirizine 500 mg twice daily.  He has been compliant with this medication, but is last dose was on the evening of 8/25.  For the last few days, he developed room spinning dizziness.  With walking, standing.  No falls.   Physical Exam  BP (!) 145/78 (BP Location: Right Arm)   Pulse 71   Temp 98.1 F (36.7 C) (Oral)   Resp 18   Ht 5' 11.5" (1.816 m)   Wt 102.1 kg   SpO2 100%   BMI 30.94 kg/m   Physical Exam Vitals and nursing note reviewed.  Constitutional:      Appearance: He is well-developed.  HENT:     Head: Normocephalic and atraumatic.  Neck:     Comments: C-collar in place Cardiovascular:     Rate and Rhythm: Normal rate.     Pulses: Normal pulses.     Heart sounds: Normal heart sounds. No murmur heard.   No friction rub. No gallop.  Pulmonary:     Effort: Pulmonary effort is normal.  Abdominal:     General: There is no distension.  Musculoskeletal:     Cervical back: Neck supple.  Neurological:     Mental Status: He is alert and oriented to person, place, and time.     Cranial Nerves: No cranial nerve deficit.     Comments: No oriented x3.  Speaks in complete, fluent sentences.  No tremors.  Sensation and motor strength are intact.  Psychiatric:        Behavior: Behavior normal.    ED Course/Procedures     Procedures  MDM  65 year old male received as a transfer from [place] for MRI.  Please see her note for further work-up and medical decision making.  Vital signs stable.  Imaging has been reviewed and independently  interpreted by me.  MRI with remote small lacunar infarcts involving the right basal ganglia external capsule.  No acute abnormality.  Discussed the patient with Dr. Regenia Skeeter, attending physician.  Spoke with Dr. Cheral Marker, neurology, he recommends follow-up with ENT as he suspects dizziness may be related to an inner ear problem.  We will discharge the patient with meclizine as needed as he reports improvement.  He was ambulated by me at bedside without ataxia.  In regards to the remote infarct, recommend starting the patient would bilirubin as per daily and following up with outpatient neurology.  Referral to ENT and neurology have been given.  All questions answered.  Patient is planning to follow-up with his PCP when they reopen on Monday.  ER return precautions given.  He is hemodynamically stable in no acute distress.  Safe for discharge home and outpatient follow-up as discussed       Joanne Gavel, PA-C 10/21/20 [place], MD 10/21/20 [phone]

## 2020-10-21 ENCOUNTER — Emergency Department (HOSPITAL_COMMUNITY): Payer: Medicare HMO

## 2020-10-21 MED ORDER — MECLIZINE HCL 12.5 MG PO TABS: 12.5000 mg | ORAL_TABLET | Freq: Three times a day (TID) | ORAL | 0 refills | Status: AC | PRN

## 2020-10-21 NOTE — Discharge Instructions (Addendum)
Thank you for allowing me to care for you today in the Emergency Department.   Please call to schedule a follow-up appointment with neurology and ENT.  Please start taking a baby aspirin daily.  You can take 1 tablet of meclizine every 8 hours as needed for dizziness.  You can follow-up with your primary care provider if you need to be assessed for a walker.  Your nighttime dose of Keppra was given in the ED.  Please resume this medication in the morning.  Return to the emergency department if you become unable to walk, have any fall or injury, develop uncontrollable vomiting, or other new, concerning symptoms.

## 2020-10-21 NOTE — ED Notes (Signed)
Patient transported to MRI 

## 2021-11-23 IMAGING — MR MR HEAD W/O CM
6 of 11 series · 25 of 48 positions shown · non-contrast
Comparison: Prior CT from 10/20/2020.

CLINICAL DATA: Initial evaluation for acute vertigo.

EXAM:
MRI HEAD WITHOUT CONTRAST
TECHNIQUE: Multiplanar, multiecho pulse sequences of the brain and surrounding
structures were obtained without intravenous contrast.

[Series 3: DWI · axial · 3.0mm · 0.94mm/px · z∈[-73,+86]mm · 7 of 99 slices shown (1 of 2)]
[im 1/99]
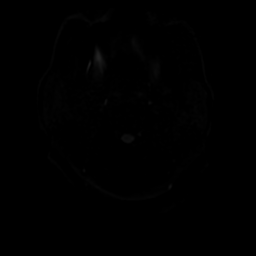
[im 17/99]
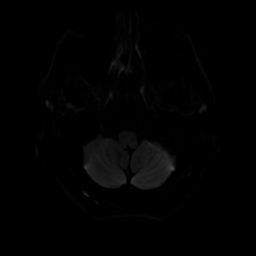
[im 33/99]
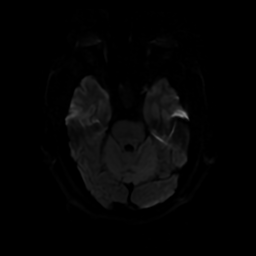
[im 50/99]
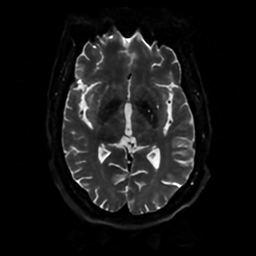
[im 66/99]
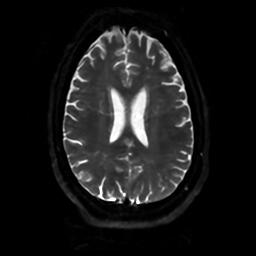
[im 82/99]
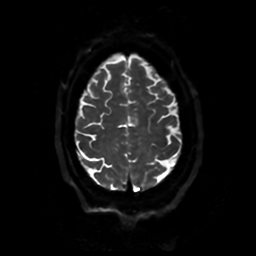
[im 99/99]
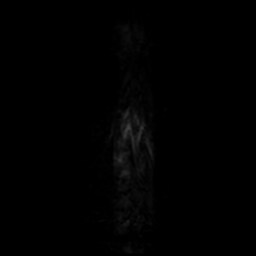

[Series 4: DWI · coronal · 4.0mm · 0.94mm/px · 6 of 80 slices shown (2 of 2)]
[im 1/80]
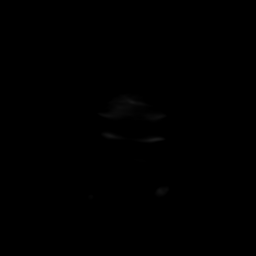
[im 16/80]
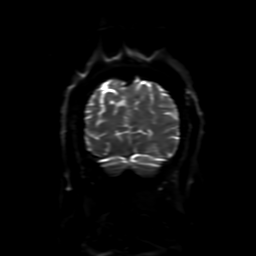
[im 32/80]
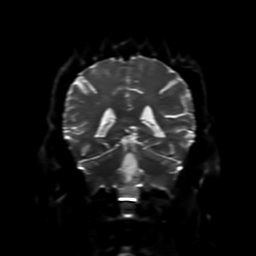
[im 48/80]
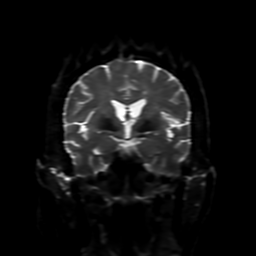
[im 64/80]
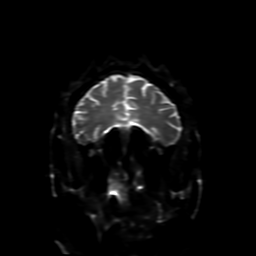
[im 80/80]
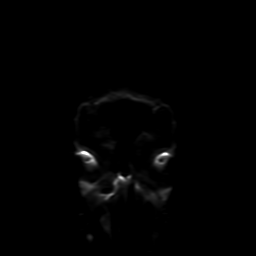

[Series 5: FLAIR · sagittal · 5.0mm · 0.23mm/px · 2 of 27 slices shown (1 of 2)]
[im 1/27]
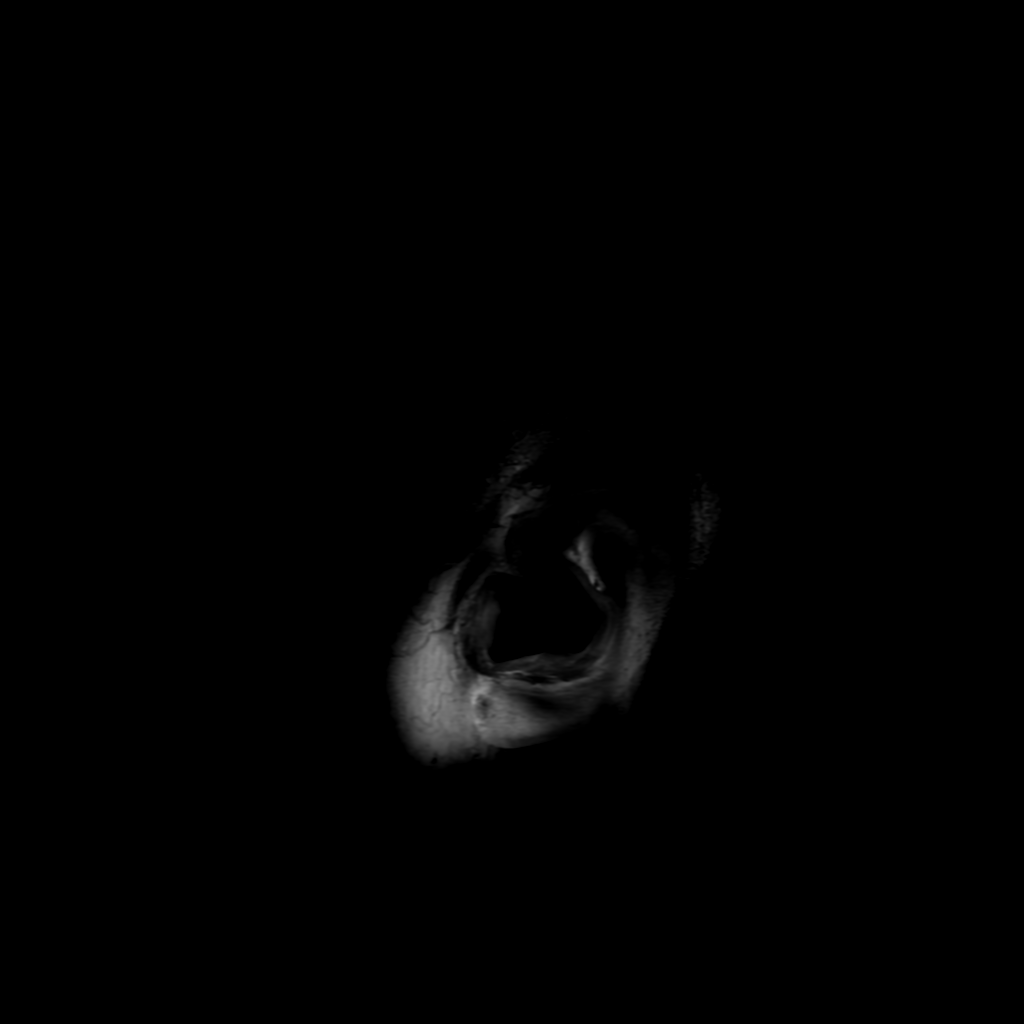
[im 27/27]
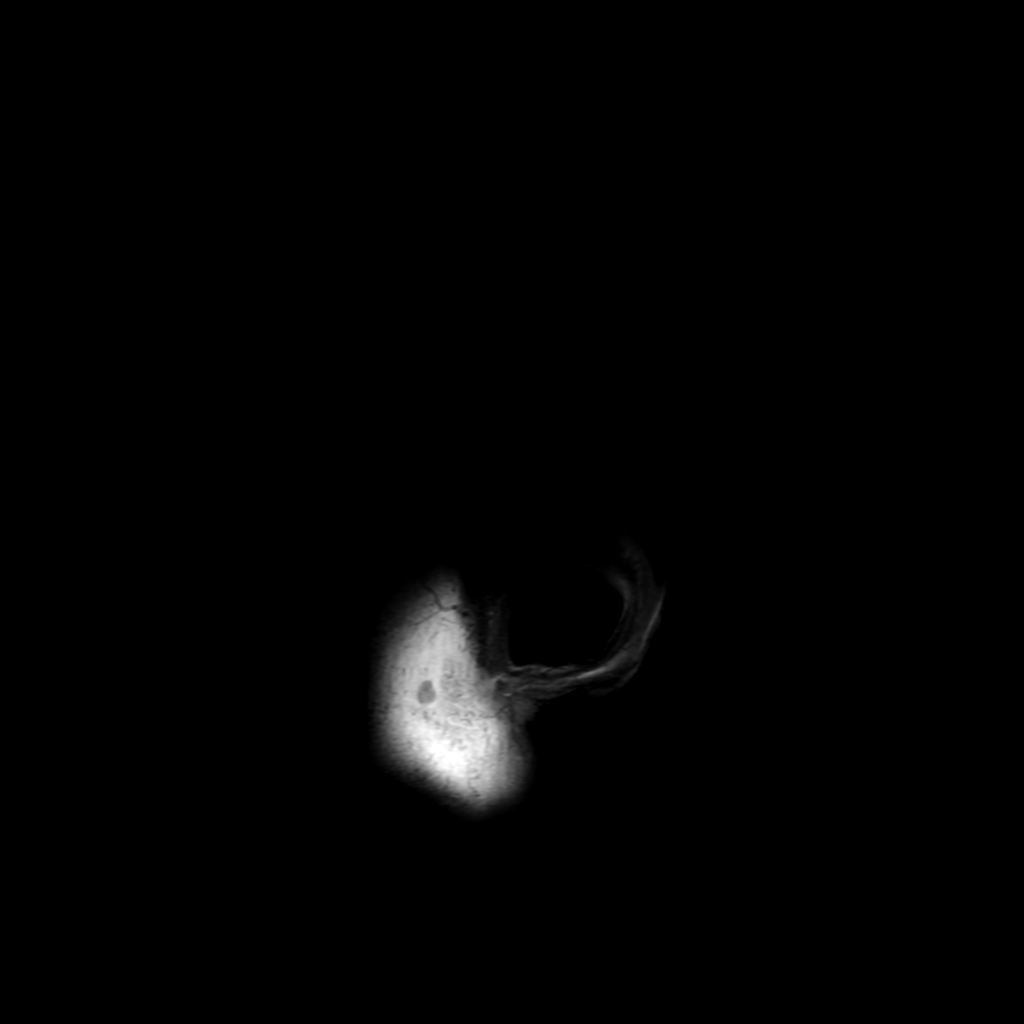

[Series 7: FLAIR · axial · 4.0mm · 0.45mm/px · z∈[-71,+87]mm · 3 of 37 slices shown (2 of 2)]
[im 1/37]
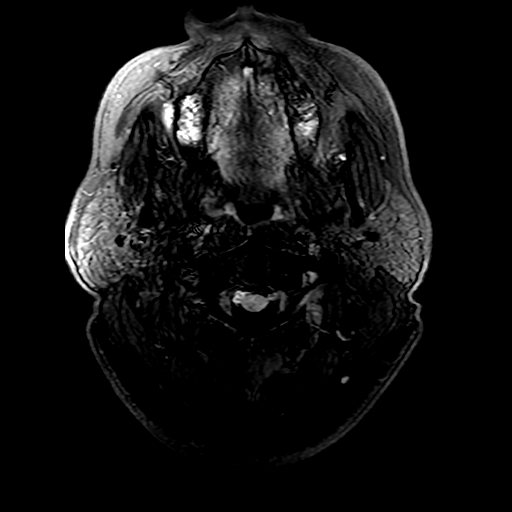
[im 19/37]
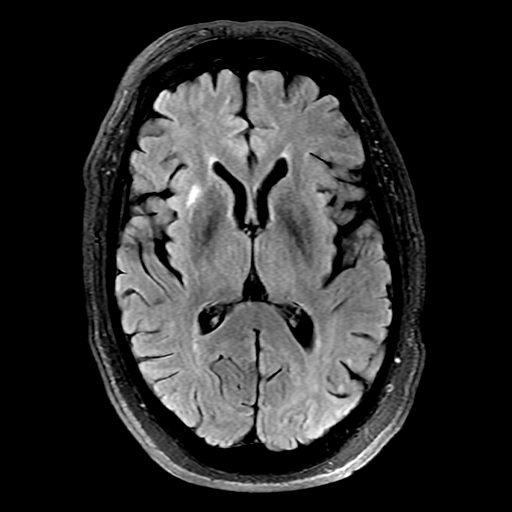
[im 37/37]
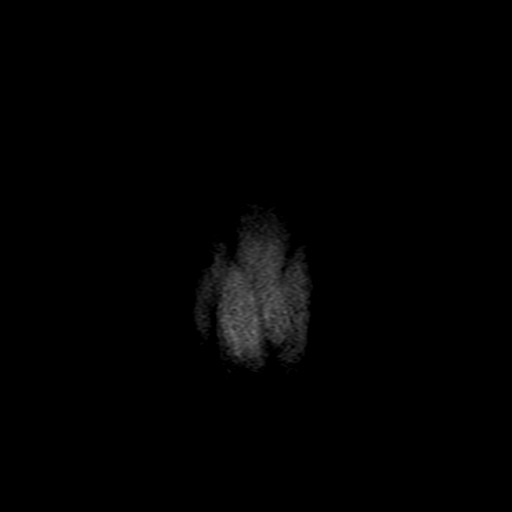

[Series 350: ADC · axial · 3.0mm · 0.94mm/px · z∈[-73,+86]mm · 4 of 54 slices shown (1 of 2)]
[im 1/54]
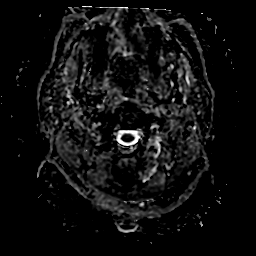
[im 18/54]
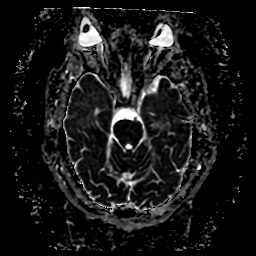
[im 36/54]
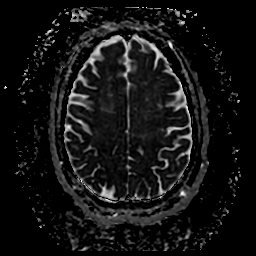
[im 54/54]
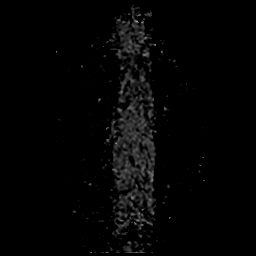

[Series 450: ADC · coronal · 4.0mm · 0.94mm/px · 3 of 38 slices shown (2 of 2)]
[im 1/38]
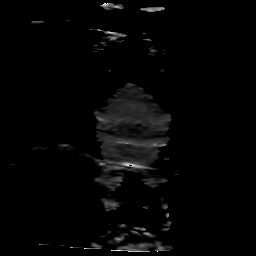
[im 19/38]
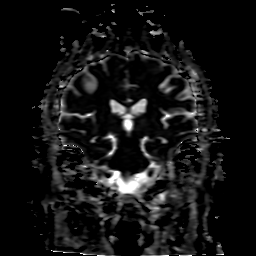
[im 38/38]
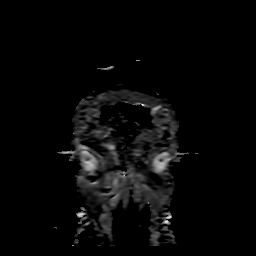

[25 of 48 positions shown; findings below may reference images not displayed]

FINDINGS: Brain: Cerebral volume within normal limits for age. Scattered
patchy T2/FLAIR hyperintensity involving the periventricular and
deep white matter both cerebral hemispheres, most consistent with
chronic microvascular ischemic disease, mild-to-moderate in nature.
Probable superimposed remote lacunar infarct noted involving the
right basal ganglia/external capsule. Additional small remote right
basal ganglia

Lacunar infarct noted as well. No abnormal foci of restricted
diffusion to suggest acute or subacute ischemia. Gray-white matter
differentiation maintained. No encephalomalacia to suggest chronic
cortical infarction. No acute intracranial hemorrhage. Single
punctate chronic microhemorrhage noted at the subcortical left
frontoparietal region, likely small vessel related.

No mass lesion, midline shift or mass effect. No hydrocephalus or
extra-axial fluid collection. Pituitary gland suprasellar region
within normal limits. Midline structures intact.

Vascular: Major intracranial vascular flow voids are maintained.

Skull and upper cervical spine: Craniocervical junction within
normal limits. Postsurgical changes noted within the visualized
upper cervical spine. Bone marrow signal intensity normal. No scalp
soft tissue abnormality.

Sinuses/Orbits: Globes and orbital soft tissues within normal
limits. Paranasal sinuses are largely clear. Trace fluid signal
intensity noted within the right mastoid air cells, of doubtful
significance. Inner ear structures grossly normal.

Other: None.
IMPRESSION: 1. No acute intracranial abnormality.
2. Mild-to-moderate chronic microvascular ischemic disease with
small remote lacunar infarcts involving the right basal
ganglia/external capsule.
